# Patient Record
Sex: Female | Born: 2000 | Race: White | Hispanic: No | Marital: Single | Smoking: Never smoker
Health system: Southern US, Community
[De-identification: ages and names within clinical notes are randomized; demographics above are authoritative.]

## PROBLEM LIST (undated history)

## (undated) HISTORY — PX: WISDOM TOOTH EXTRACTION: SHX21

---
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
## 2019-08-16 ENCOUNTER — Emergency Department: Payer: Self-pay

## 2019-08-16 ENCOUNTER — Encounter: Payer: Self-pay | Admitting: *Deleted

## 2019-08-16 ENCOUNTER — Other Ambulatory Visit: Payer: Self-pay

## 2019-08-16 ENCOUNTER — Emergency Department
Admission: EM | Admit: 2019-08-16 | Discharge: 2019-08-16 | Disposition: A | Discharge status: Home / Self Care | Payer: Self-pay | Attending: Student in an Organized Health Care Education/Training Program | Admitting: Student in an Organized Health Care Education/Training Program

## 2019-08-16 DIAGNOSIS — Z91040 Latex allergy status: Secondary | ICD-10-CM | POA: Insufficient documentation

## 2019-08-16 DIAGNOSIS — J029 Acute pharyngitis, unspecified: Secondary | ICD-10-CM | POA: Insufficient documentation

## 2019-08-16 DIAGNOSIS — R1012 Left upper quadrant pain: Secondary | ICD-10-CM | POA: Insufficient documentation

## 2019-08-16 LAB — CBC
HCT: 37.8 % (ref 36.0–46.0)
Hemoglobin: 12.8 g/dL (ref 12.0–15.0)
MCH: 31.2 pg (ref 26.0–34.0)
MCHC: 33.9 g/dL (ref 30.0–36.0)
MCV: 92.2 fL (ref 80.0–100.0)
Platelets: 303 10*3/uL (ref 150–400)
RBC: 4.1 MIL/uL (ref 3.87–5.11)
RDW: 12.1 % (ref 11.5–15.5)
WBC: 7.6 10*3/uL (ref 4.0–10.5)
nRBC: 0 % (ref 0.0–0.2)

## 2019-08-16 LAB — LIPASE, BLOOD: Lipase: 28 U/L (ref 11–51)

## 2019-08-16 LAB — URINALYSIS, COMPLETE (UACMP) WITH MICROSCOPIC
Bacteria, UA: NONE SEEN
Bilirubin Urine: NEGATIVE
Glucose, UA: NEGATIVE mg/dL
Hgb urine dipstick: NEGATIVE
Ketones, ur: NEGATIVE mg/dL
Leukocytes,Ua: NEGATIVE
Nitrite: NEGATIVE
Protein, ur: NEGATIVE mg/dL
Specific Gravity, Urine: 1.016 (ref 1.005–1.030)
pH: 6 (ref 5.0–8.0)

## 2019-08-16 LAB — COMPREHENSIVE METABOLIC PANEL
ALT: 12 U/L (ref 0–44)
AST: 15 U/L (ref 15–41)
Albumin: 4.4 g/dL (ref 3.5–5.0)
Alkaline Phosphatase: 55 U/L (ref 38–126)
Anion gap: 8 (ref 5–15)
BUN: 10 mg/dL (ref 6–20)
CO2: 27 mmol/L (ref 22–32)
Calcium: 9.4 mg/dL (ref 8.9–10.3)
Chloride: 103 mmol/L (ref 98–111)
Creatinine, Ser: 0.71 mg/dL (ref 0.44–1.00)
GFR calc Af Amer: >60 mL/min (ref >60)
GFR calc non Af Amer: >60 mL/min (ref >60)
Glucose, Bld: 98 mg/dL (ref 70–99)
Potassium: 4.2 mmol/L (ref 3.5–5.1)
Sodium: 138 mmol/L (ref 135–145)
Total Bilirubin: 0.4 mg/dL (ref 0.3–1.2)
Total Protein: 7.7 g/dL (ref 6.5–8.1)

## 2019-08-16 LAB — POCT PREGNANCY, URINE: Preg Test, Ur: NEGATIVE

## 2019-08-16 MED ORDER — SODIUM CHLORIDE 0.9% FLUSH: 3.0000 mL | Freq: Once | INTRAVENOUS | Status: DC

## 2019-08-16 MED ORDER — LORAZEPAM 1 MG PO TABS
1.0000 mg | ORAL_TABLET | Freq: Once | ORAL | Status: AC
  Administered 2019-08-16: 1 mg via ORAL
  Filled 2019-08-16: qty 1

## 2019-08-16 MED ORDER — DEXAMETHASONE 6 MG PO TABS: 6.0000 mg | ORAL_TABLET | Freq: Once | ORAL | 0 refills | Status: AC

## 2019-08-16 NOTE — ED Notes (Signed)
poct pregnancy Negative 

## 2019-08-16 NOTE — ED Notes (Signed)
Pt to US.

## 2019-08-16 NOTE — ED Notes (Signed)
Pt verbalized worry that she may have a STI, MD notified

## 2019-08-16 NOTE — ED Triage Notes (Signed)
Pt has left upper abd pain  Pt was told at urgent care that spleen is enlarged.  Pt states pain is worse.  Pt alert

## 2019-08-16 NOTE — ED Provider Notes (Signed)
[facility] Emergency Department Provider Note    First MD Initiated Contact with Patient 08/16/19 2112     (approximate)  I have reviewed the triage vital signs and the nursing notes.   HISTORY  Chief Complaint Abdominal Pain    HPI Leslie Buckley is a 18 y.o. female presents the ER for 3 weeks of sore throat now with progressing left upper quadrant abdominal pain was seen by urgent care today she said negative mono test but was told to come to the ER due to concern for splenomegaly and left upper quadrant pain.  Denies any trauma.  No nausea or vomiting.    No past medical history on file. No family history on file. Past Surgical History:  Procedure Laterality Date  . WISDOM TOOTH EXTRACTION     There are no active problems to display for this patient.     Prior to Admission medications   Medication Sig Start Date End Date Taking? Authorizing Provider  dexamethasone (DECADRON) 6 MG tablet Take 1 tablet (6 mg total) by mouth once for 1 dose. 08/16/19 08/16/19  Willy Eddyobinson, Quinton Voth, MD  ibuprofen (ADVIL,MOTRIN) 400 MG tablet Take 1 tablet (400 mg total) by mouth every 6 (six) hours as needed. 11/24/18   Domenick GongMortenson, Ashley, MD  ondansetron (ZOFRAN) 4 MG tablet Take 1 tablet (4 mg total) by mouth every 6 (six) hours. 11/24/18   Domenick GongMortenson, Ashley, MD    Allergies Penicillins, Pollen extract, and Latex    Social History Social History   Tobacco Use  . Smoking status: Never Smoker  . Smokeless tobacco: Never Used  Substance Use Topics  . Alcohol use: Not Currently  . Drug use: Never    Review of Systems Patient denies headaches, rhinorrhea, blurry vision, numbness, shortness of breath, chest pain, edema, cough, abdominal pain, nausea, vomiting, diarrhea, dysuria, fevers, rashes or hallucinations unless otherwise stated above in HPI. ____________________________________________   PHYSICAL EXAM:  VITAL SIGNS: Vitals:   08/16/19 1953 08/16/19  2234  BP: 125/71 102/62  Pulse: 91 84  Resp: 16 16  Temp: 98.9 F (37.2 C)   SpO2: 98% 99%    Constitutional: Alert and oriented.  Eyes: Conjunctivae are normal.  Head: Atraumatic. Nose: No congestion/rhinnorhea. Mouth/Throat: Mucous membranes are moist.   Neck: No stridor. Painless ROM.  Cardiovascular: Normal rate, regular rhythm. Grossly normal heart sounds.  Good peripheral circulation. Respiratory: Normal respiratory effort.  No retractions. Lungs CTAB. Gastrointestinal: Soft with tenderness palpation left upper quadrant.  No guarding.. No distention. No abdominal bruits. No CVA tenderness. Genitourinary:  Musculoskeletal: No lower extremity tenderness nor edema.  No joint effusions. Neurologic:  Normal speech and language. No gross focal neurologic deficits are appreciated. No facial droop Skin:  Skin is warm, dry and intact. No rash noted. Psychiatric: Mood and affect are normal. Speech and behavior are normal.  ____________________________________________   LABS (all labs ordered are listed, but only abnormal results are displayed)  Results for orders placed or performed during the hospital encounter of 08/16/19 (from the past 24 hour(s))  Lipase, blood     Status: None   Collection Time: 08/16/19  7:57 PM  Result Value Ref Range   Lipase 28 11 - 51 U/L  Comprehensive metabolic panel     Status: None   Collection Time: 08/16/19  7:57 PM  Result Value Ref Range   Sodium 138 135 - 145 mmol/L   Potassium 4.2 3.5 - 5.1 mmol/L   Chloride 103 98 - 111  mmol/L   CO2 27 22 - 32 mmol/L   Glucose, Bld 98 70 - 99 mg/dL   BUN 10 6 - 20 mg/dL   Creatinine, Ser 1.27 0.44 - 1.00 mg/dL   Calcium 9.4 8.9 - 51.7 mg/dL   Total Protein 7.7 6.5 - 8.1 g/dL   Albumin 4.4 3.5 - 5.0 g/dL   AST 15 15 - 41 U/L   ALT 12 0 - 44 U/L   Alkaline Phosphatase 55 38 - 126 U/L   Total Bilirubin 0.4 0.3 - 1.2 mg/dL   GFR calc non Af Amer >60 >60 mL/min   GFR calc Af Amer >60 >60 mL/min   Anion  gap 8 5 - 15  CBC     Status: None   Collection Time: 08/16/19  7:57 PM  Result Value Ref Range   WBC 7.6 4.0 - 10.5 K/uL   RBC 4.10 3.87 - 5.11 MIL/uL   Hemoglobin 12.8 12.0 - 15.0 g/dL   HCT 00.1 74.9 - 44.9 %   MCV 92.2 80.0 - 100.0 fL   MCH 31.2 26.0 - 34.0 pg   MCHC 33.9 30.0 - 36.0 g/dL   RDW 67.5 91.6 - 38.4 %   Platelets 303 150 - 400 K/uL   nRBC 0.0 0.0 - 0.2 %  Urinalysis, Complete w Microscopic     Status: Abnormal   Collection Time: 08/16/19  7:57 PM  Result Value Ref Range   Color, Urine YELLOW (A) YELLOW   APPearance HAZY (A) CLEAR   Specific Gravity, Urine 1.016 1.005 - 1.030   pH 6.0 5.0 - 8.0   Glucose, UA NEGATIVE NEGATIVE mg/dL   Hgb urine dipstick NEGATIVE NEGATIVE   Bilirubin Urine NEGATIVE NEGATIVE   Ketones, ur NEGATIVE NEGATIVE mg/dL   Protein, ur NEGATIVE NEGATIVE mg/dL   Nitrite NEGATIVE NEGATIVE   Leukocytes,Ua NEGATIVE NEGATIVE   RBC / HPF 0-5 0 - 5 RBC/hpf   WBC, UA 0-5 0 - 5 WBC/hpf   Bacteria, UA NONE SEEN NONE SEEN   Squamous Epithelial / LPF 0-5 0 - 5   Mucus PRESENT    Amorphous Crystal PRESENT   Pregnancy, urine POC     Status: None   Collection Time: 08/16/19  8:01 PM  Result Value Ref Range   Preg Test, Ur NEGATIVE NEGATIVE   ____________________________________________ ____________________________________________  RADIOLOGY  I personally reviewed all radiographic images ordered to evaluate for the above acute complaints and reviewed radiology reports and findings.  These findings were personally discussed with the patient.  Please see medical record for radiology report. ____________________________________________   PROCEDURES  Procedure(s) performed:  Procedures    Critical Care performed: no ____________________________________________   INITIAL IMPRESSION / ASSESSMENT AND PLAN / ED COURSE  Pertinent labs & imaging results that were available during my care of the patient were reviewed by me and considered in my  medical decision making (see chart for details).   DDX: Mono, splenomegaly, musculoskeletal strain, colitis  Leslie Buckley is a 18 y.o. who presents to the ED with symptoms as described above.  Patient pleasant afebrile hemodynamically stable.  Does have some left upper quadrant tenderness palpation on exam.  Denies any trauma based on the discomfort that she is having I do think that ultrasound is warranted to evaluate for splenomegaly and rule out rupture though unlikely given stable hgb and vitals.   Clinical Course as of Aug 15 2242  Tue Aug 16, 2019  2242 Ultrasound is reassuring.  Repeat exam  benign.  Patient is stable and appropriate for outpatient follow-up.  We discussed conservative management expected course of mono as I have a high clinical suspicion that that is her diagnosis.  No evidence of PTA or RPA.  Normal phonation.  Not consistent with appendicitis or cholecystitis.  Tolerating oral hydration.     [PR]    Clinical Course User Index [PR] Merlyn Lot, MD    The patient was evaluated in Emergency Department today for the symptoms described in the history of present illness. He/she was evaluated in the context of the global COVID-19 pandemic, which necessitated consideration that the patient might be at risk for infection with the SARS-CoV-2 virus that causes COVID-19. Institutional protocols and algorithms that pertain to the evaluation of patients at risk for COVID-19 are in a state of rapid change based on information released by regulatory bodies including the CDC and federal and state organizations. These policies and algorithms were followed during the patient's care in the ED.  As part of my medical decision making, I reviewed the following data within the [place] notes reviewed and incorporated, Labs reviewed, notes from prior ED visits and [place] Controlled Substance Database   ____________________________________________   FINAL  CLINICAL IMPRESSION(S) / ED DIAGNOSES  Final diagnoses:  Sore throat  Left upper quadrant abdominal pain      NEW MEDICATIONS STARTED DURING THIS VISIT:  New Prescriptions   DEXAMETHASONE (DECADRON) 6 MG TABLET    Take 1 tablet (6 mg total) by mouth once for 1 dose.     Note:  This document was prepared using Dragon voice recognition software and may include unintentional dictation errors.    Merlyn Lot, MD 08/16/19 2243

## 2019-08-16 NOTE — Discharge Instructions (Addendum)

## 2019-08-16 NOTE — ED Notes (Signed)
Lab results reviewed. Awaiting room for MD eval.  

## 2019-12-21 LAB — GC/CHLAMYDIA PROBE AMP
Chlamydia trachomatis, NAA: NEGATIVE
Neisseria Gonorrhoeae by PCR: NEGATIVE

## 2020-02-19 IMAGING — US US ABDOMEN LIMITED
1 series · 12 of 12 positions shown · non-contrast
Comparison: None.

CLINICAL DATA: Left upper quadrant pain

EXAM:
ULTRASOUND ABDOMEN LIMITED

[Series 1: us abdomen limited · 0.19mm/px · 12 of 12 slices shown]
[im 1/12]
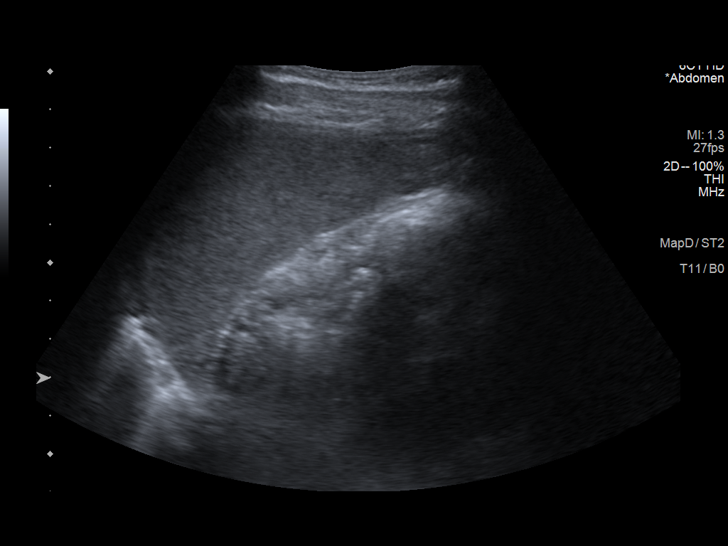
[im 2/12]
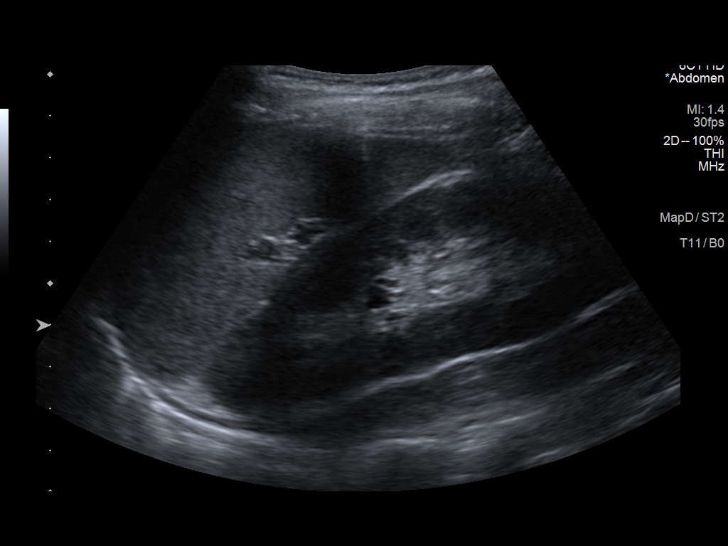
[im 3/12]
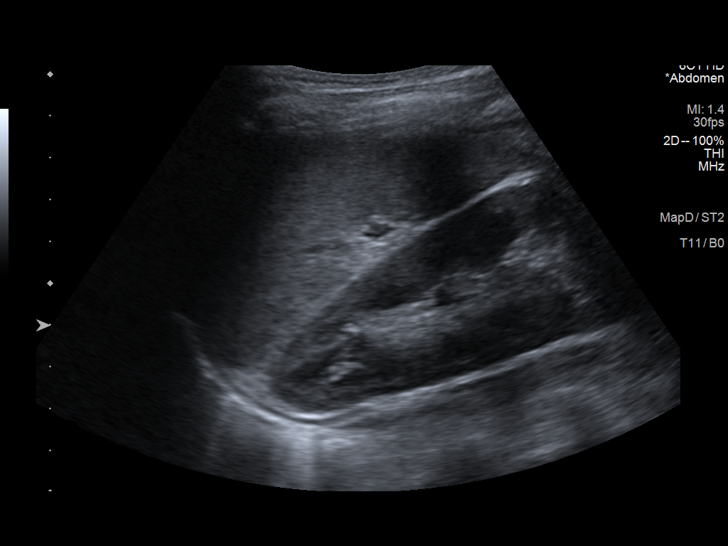
[im 4/12]
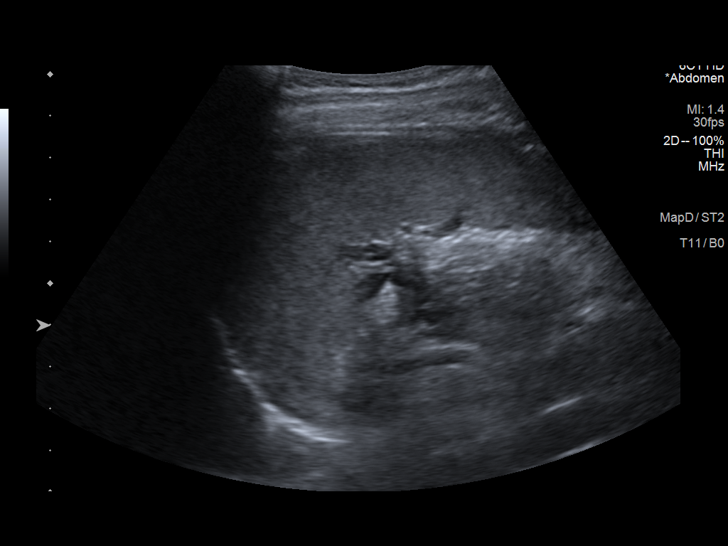
[im 5/12]
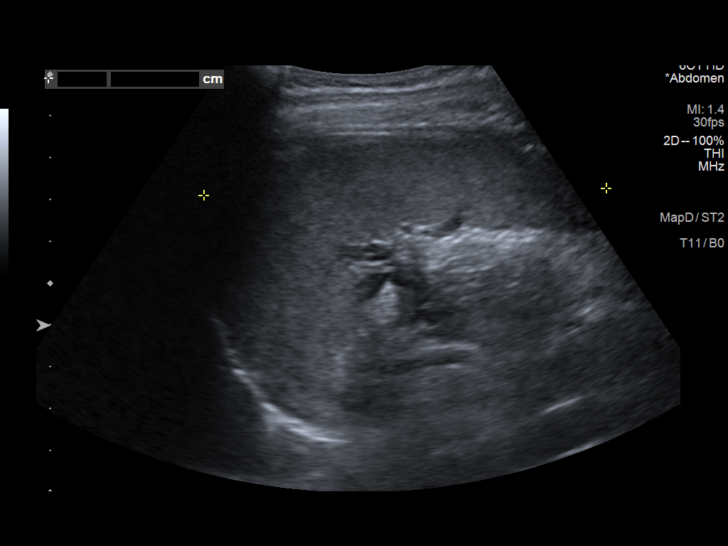
[im 6/12]
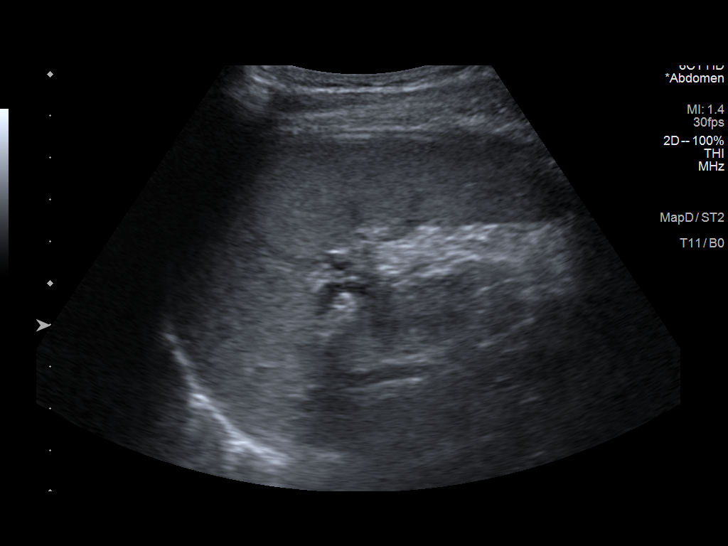
[im 7/12]
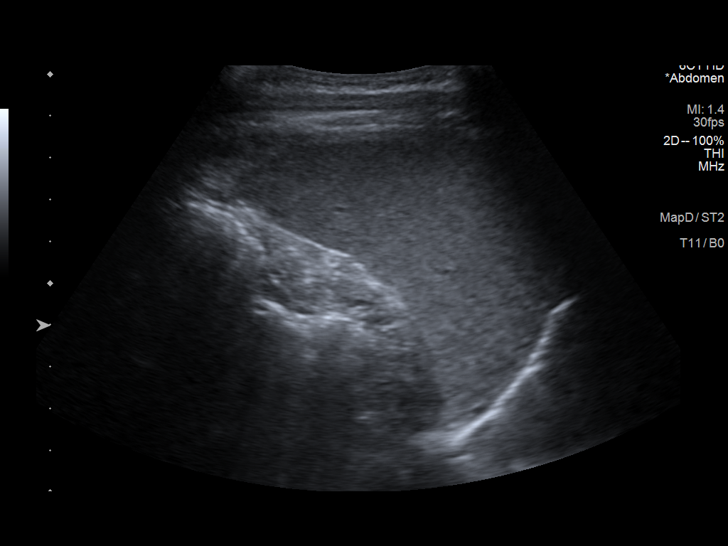
[im 8/12]
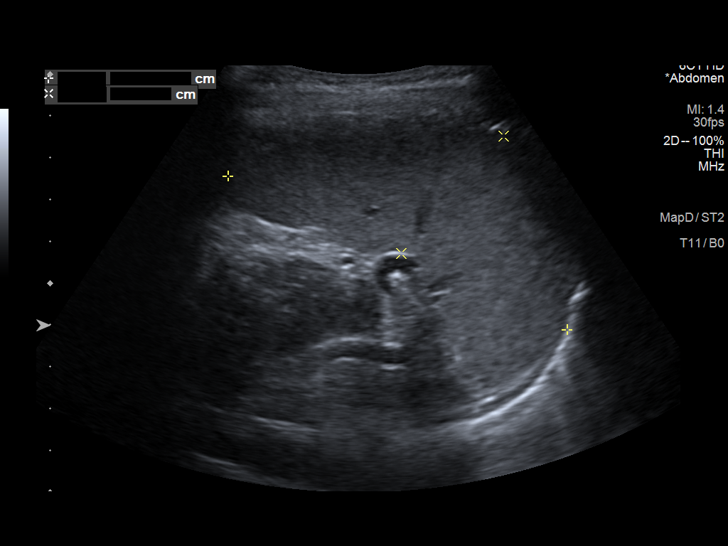
[im 9/12]
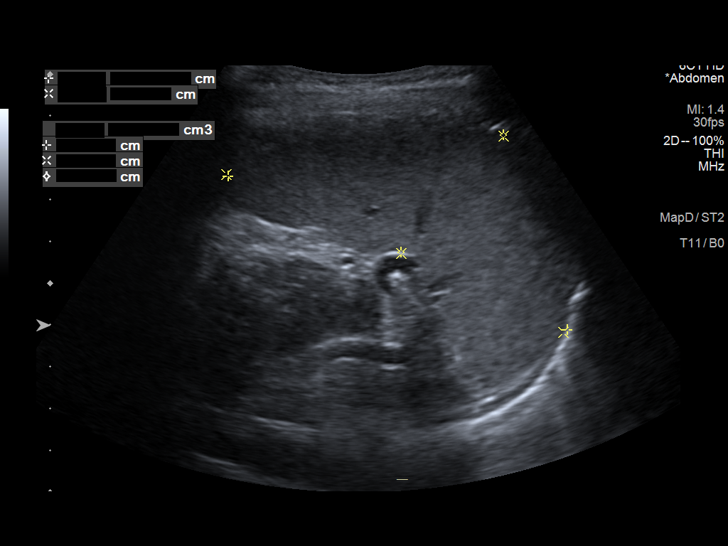
[im 10/12]
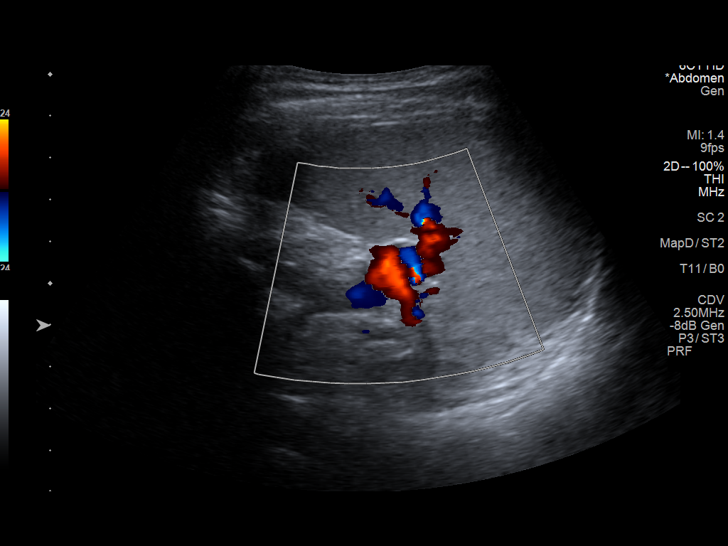
[im 11/12]
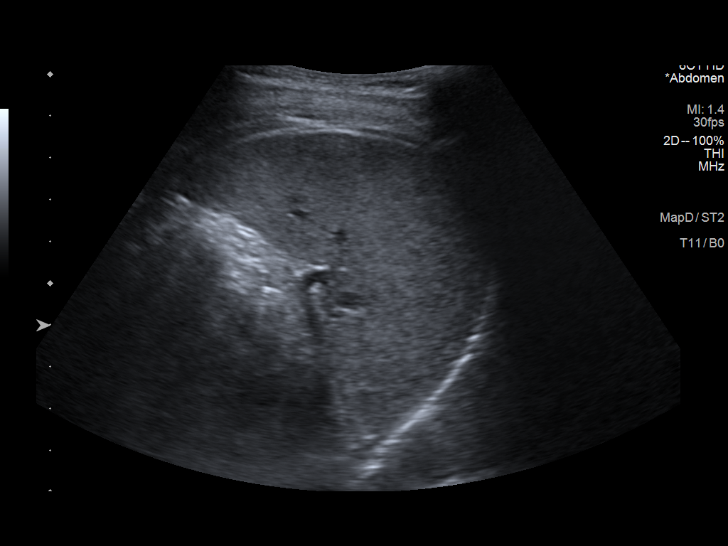
[im 12/12]
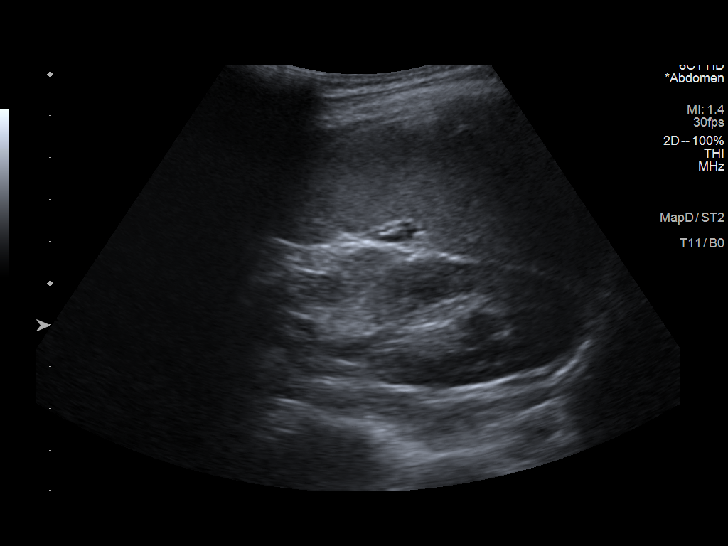

[12 of 12 positions shown; findings below may reference images not displayed]

FINDINGS: Targeted ultrasound of left upper quadrant performed to evaluate the
spleen. The spleen is within normal limits measuring 9.6 x 8.9 x
cm with a volume of 167 mL. No perisplenic fluid collection.
IMPRESSION: Negative ultrasound of the spleen
